# Patient Record
Sex: Female | Born: 1970 | Race: Black or African American | Hispanic: No | Marital: Married | Smoking: Never smoker
Health system: Southern US, Community
[De-identification: ages and names within clinical notes are randomized; demographics above are authoritative.]

## PROBLEM LIST (undated history)

## (undated) DIAGNOSIS — N760 Acute vaginitis: Secondary | ICD-10-CM

## (undated) DIAGNOSIS — IMO0002 Reserved for concepts with insufficient information to code with codable children: Secondary | ICD-10-CM

## (undated) DIAGNOSIS — L659 Nonscarring hair loss, unspecified: Secondary | ICD-10-CM

## (undated) DIAGNOSIS — B9689 Other specified bacterial agents as the cause of diseases classified elsewhere: Secondary | ICD-10-CM

## (undated) DIAGNOSIS — N644 Mastodynia: Secondary | ICD-10-CM

## (undated) DIAGNOSIS — F32A Depression, unspecified: Secondary | ICD-10-CM

## (undated) DIAGNOSIS — N390 Urinary tract infection, site not specified: Secondary | ICD-10-CM

## (undated) DIAGNOSIS — J45909 Unspecified asthma, uncomplicated: Secondary | ICD-10-CM

## (undated) DIAGNOSIS — F419 Anxiety disorder, unspecified: Secondary | ICD-10-CM

## (undated) HISTORY — DX: Reserved for concepts with insufficient information to code with codable children: IMO0002

## (undated) HISTORY — DX: Urinary tract infection, site not specified: N39.0

## (undated) HISTORY — DX: Mastodynia: N64.4

## (undated) HISTORY — DX: Other specified bacterial agents as the cause of diseases classified elsewhere: N76.0

## (undated) HISTORY — DX: Nonscarring hair loss, unspecified: L65.9

## (undated) HISTORY — DX: Other specified bacterial agents as the cause of diseases classified elsewhere: B96.89

---

## 1998-06-14 ENCOUNTER — Ambulatory Visit (HOSPITAL_COMMUNITY): Admission: RE | Admit: 1998-06-14 | Discharge: 1998-06-14 | Payer: Self-pay | Admitting: Family Medicine

## 1999-02-06 ENCOUNTER — Inpatient Hospital Stay (HOSPITAL_COMMUNITY): Admission: AD | Admit: 1999-02-06 | Discharge: 1999-02-10 | Payer: Self-pay | Admitting: Obstetrics and Gynecology

## 1999-08-18 ENCOUNTER — Ambulatory Visit (HOSPITAL_COMMUNITY): Admission: RE | Admit: 1999-08-18 | Discharge: 1999-08-18 | Payer: Self-pay | Admitting: *Deleted

## 1999-08-18 ENCOUNTER — Encounter: Payer: Self-pay | Admitting: *Deleted

## 2002-08-07 ENCOUNTER — Other Ambulatory Visit: Admission: RE | Admit: 2002-08-07 | Discharge: 2002-08-07 | Payer: Self-pay | Admitting: Obstetrics and Gynecology

## 2003-08-07 ENCOUNTER — Ambulatory Visit (HOSPITAL_COMMUNITY): Admission: RE | Admit: 2003-08-07 | Discharge: 2003-08-07 | Payer: Self-pay | Admitting: *Deleted

## 2003-08-07 ENCOUNTER — Encounter (INDEPENDENT_AMBULATORY_CARE_PROVIDER_SITE_OTHER): Payer: Self-pay | Admitting: *Deleted

## 2003-09-10 ENCOUNTER — Other Ambulatory Visit: Admission: RE | Admit: 2003-09-10 | Discharge: 2003-09-10 | Payer: Self-pay | Admitting: Obstetrics and Gynecology

## 2003-12-30 ENCOUNTER — Ambulatory Visit: Admission: RE | Admit: 2003-12-30 | Discharge: 2003-12-30 | Payer: Self-pay | Admitting: Family Medicine

## 2004-09-09 ENCOUNTER — Other Ambulatory Visit: Admission: RE | Admit: 2004-09-09 | Discharge: 2004-09-09 | Payer: Self-pay | Admitting: Obstetrics and Gynecology

## 2005-09-19 ENCOUNTER — Other Ambulatory Visit: Admission: RE | Admit: 2005-09-19 | Discharge: 2005-09-19 | Payer: Self-pay | Admitting: Obstetrics and Gynecology

## 2006-09-25 ENCOUNTER — Other Ambulatory Visit: Admission: RE | Admit: 2006-09-25 | Discharge: 2006-09-25 | Payer: Self-pay | Admitting: Obstetrics and Gynecology

## 2007-03-30 ENCOUNTER — Encounter: Admission: RE | Admit: 2007-03-30 | Discharge: 2007-03-30 | Payer: Self-pay | Admitting: Allergy and Immunology

## 2009-10-26 DIAGNOSIS — IMO0002 Reserved for concepts with insufficient information to code with codable children: Secondary | ICD-10-CM

## 2009-10-26 DIAGNOSIS — R87619 Unspecified abnormal cytological findings in specimens from cervix uteri: Secondary | ICD-10-CM

## 2009-10-26 HISTORY — DX: Unspecified abnormal cytological findings in specimens from cervix uteri: R87.619

## 2009-10-26 HISTORY — DX: Reserved for concepts with insufficient information to code with codable children: IMO0002

## 2010-06-20 ENCOUNTER — Emergency Department (HOSPITAL_BASED_OUTPATIENT_CLINIC_OR_DEPARTMENT_OTHER): Admission: EM | Admit: 2010-06-20 | Discharge: 2010-06-21 | Payer: Self-pay | Admitting: Emergency Medicine

## 2011-06-06 ENCOUNTER — Ambulatory Visit: Payer: Private Health Insurance - Indemnity | Attending: Orthopedic Surgery | Admitting: Physical Therapy

## 2011-06-06 DIAGNOSIS — M25569 Pain in unspecified knee: Secondary | ICD-10-CM | POA: Insufficient documentation

## 2011-06-06 DIAGNOSIS — M25539 Pain in unspecified wrist: Secondary | ICD-10-CM | POA: Insufficient documentation

## 2011-06-06 DIAGNOSIS — IMO0001 Reserved for inherently not codable concepts without codable children: Secondary | ICD-10-CM | POA: Insufficient documentation

## 2011-06-13 ENCOUNTER — Ambulatory Visit: Payer: Private Health Insurance - Indemnity | Admitting: Physical Therapy

## 2011-06-15 ENCOUNTER — Encounter: Payer: Private Health Insurance - Indemnity | Admitting: Physical Therapy

## 2011-06-19 ENCOUNTER — Ambulatory Visit: Payer: Private Health Insurance - Indemnity | Admitting: Physical Therapy

## 2011-06-22 ENCOUNTER — Encounter: Payer: Private Health Insurance - Indemnity | Admitting: Physical Therapy

## 2011-06-26 ENCOUNTER — Ambulatory Visit: Payer: Private Health Insurance - Indemnity | Admitting: Physical Therapy

## 2011-06-28 ENCOUNTER — Ambulatory Visit: Payer: Private Health Insurance - Indemnity | Admitting: Physical Therapy

## 2011-07-11 ENCOUNTER — Ambulatory Visit: Payer: Private Health Insurance - Indemnity | Attending: Orthopedic Surgery | Admitting: Physical Therapy

## 2011-07-11 DIAGNOSIS — IMO0001 Reserved for inherently not codable concepts without codable children: Secondary | ICD-10-CM | POA: Insufficient documentation

## 2011-07-11 DIAGNOSIS — M25539 Pain in unspecified wrist: Secondary | ICD-10-CM | POA: Insufficient documentation

## 2011-07-11 DIAGNOSIS — M25569 Pain in unspecified knee: Secondary | ICD-10-CM | POA: Insufficient documentation

## 2011-07-13 ENCOUNTER — Encounter: Payer: Private Health Insurance - Indemnity | Admitting: Physical Therapy

## 2011-07-18 ENCOUNTER — Encounter: Payer: Private Health Insurance - Indemnity | Admitting: Physical Therapy

## 2011-07-20 ENCOUNTER — Ambulatory Visit: Payer: Private Health Insurance - Indemnity | Admitting: Physical Therapy

## 2011-07-25 ENCOUNTER — Ambulatory Visit: Payer: Private Health Insurance - Indemnity | Admitting: Physical Therapy

## 2011-07-27 ENCOUNTER — Ambulatory Visit: Payer: Private Health Insurance - Indemnity | Admitting: Physical Therapy

## 2012-01-23 ENCOUNTER — Encounter (INDEPENDENT_AMBULATORY_CARE_PROVIDER_SITE_OTHER): Payer: Private Health Insurance - Indemnity | Admitting: Obstetrics and Gynecology

## 2012-01-23 ENCOUNTER — Other Ambulatory Visit: Payer: Self-pay | Admitting: Obstetrics and Gynecology

## 2012-01-23 DIAGNOSIS — N644 Mastodynia: Secondary | ICD-10-CM

## 2012-01-23 DIAGNOSIS — L659 Nonscarring hair loss, unspecified: Secondary | ICD-10-CM

## 2012-01-23 DIAGNOSIS — R21 Rash and other nonspecific skin eruption: Secondary | ICD-10-CM

## 2012-01-23 HISTORY — DX: Mastodynia: N64.4

## 2012-01-23 HISTORY — DX: Nonscarring hair loss, unspecified: L65.9

## 2012-01-25 ENCOUNTER — Ambulatory Visit
Admission: RE | Admit: 2012-01-25 | Discharge: 2012-01-25 | Disposition: A | Discharge status: Home / Self Care | Payer: Private Health Insurance - Indemnity | Source: Ambulatory Visit | Attending: Obstetrics and Gynecology | Admitting: Obstetrics and Gynecology

## 2012-01-25 DIAGNOSIS — N644 Mastodynia: Secondary | ICD-10-CM

## 2012-03-05 ENCOUNTER — Encounter: Payer: Private Health Insurance - Indemnity | Admitting: Obstetrics and Gynecology

## 2012-03-06 ENCOUNTER — Telehealth: Payer: Self-pay | Admitting: Obstetrics and Gynecology

## 2012-03-06 NOTE — Telephone Encounter (Signed)
triage

## 2012-03-12 ENCOUNTER — Encounter: Payer: Private Health Insurance - Indemnity | Admitting: Obstetrics and Gynecology

## 2012-04-11 ENCOUNTER — Encounter: Payer: Private Health Insurance - Indemnity | Admitting: Obstetrics and Gynecology

## 2012-05-08 ENCOUNTER — Ambulatory Visit (INDEPENDENT_AMBULATORY_CARE_PROVIDER_SITE_OTHER): Payer: Private Health Insurance - Indemnity | Admitting: Obstetrics and Gynecology

## 2012-05-08 ENCOUNTER — Encounter: Payer: Self-pay | Admitting: Obstetrics and Gynecology

## 2012-05-08 VITALS — BP 118/80 | Ht 60.0 in | Wt 177.0 lb

## 2012-05-08 DIAGNOSIS — N644 Mastodynia: Secondary | ICD-10-CM

## 2012-05-08 NOTE — Progress Notes (Signed)
Pt here for f/u of breast pain.  Pain does not occur all the time but now happens two weeks before menses BP 118/80  Ht 5' (1.524 m)  Wt 177 lb (80.287 kg)  BMI 34.57 kg/m2  LMP 05/03/2012 Physical Examination: General appearance - alert, well appearing, and in no distress Chest - clear to auscultation, no wheezes, rales or rhonchi, symmetric air entry Heart - normal rate and regular rhythm Abdomen - soft, nontender, nondistended, no masses or organomegaly Breasts - breasts appear normal, no suspicious masses, no skin or nipple changes or axillary nodes Breast pain Mammogram WNL Pt considering changing BC and using continuous Motrin PRn Support bra

## 2012-05-08 NOTE — Patient Instructions (Signed)
Patient Education Materials to be provided at check out (*indicates is located in Garment/textile technologist):  *Nexplanon, *Nuvaring

## 2012-05-22 ENCOUNTER — Telehealth: Payer: Self-pay

## 2012-05-22 NOTE — Telephone Encounter (Signed)
Spoke with pt rgd msg informed rx changed to lo loestrin per nd f/u 4 months pt will come by to pick up rx

## 2012-05-22 NOTE — Telephone Encounter (Signed)
Message copied by Rolla Plate on Wed May 22, 2012  3:40 PM ------      Message from: Jaymes Graff      Created: Wed May 22, 2012  2:30 PM       Pt may have Lo Lo estrin one tab PO QD.  Give three refillls and pt needs to f/u in 4 months      ----- Message -----         From: Claudie Leach, CMA         Sent: 05/20/2012  10:22 AM           To: Michael Litter, MD            Pt calling with decision pt wants birth control pills with low dose estrogen pt wants to pick up rx

## 2012-05-22 NOTE — Telephone Encounter (Signed)
Spoke with pt rgd msg informed pt will consult with [place] rgd changing bc and call her back pt voice understanding

## 2012-06-04 ENCOUNTER — Other Ambulatory Visit: Payer: Self-pay

## 2012-10-28 ENCOUNTER — Other Ambulatory Visit: Payer: Self-pay | Admitting: Allergy and Immunology

## 2012-10-28 ENCOUNTER — Ambulatory Visit
Admission: RE | Admit: 2012-10-28 | Discharge: 2012-10-28 | Disposition: A | Discharge status: Home / Self Care | Payer: Managed Care, Other (non HMO) | Source: Ambulatory Visit | Attending: Allergy and Immunology | Admitting: Allergy and Immunology

## 2012-10-28 DIAGNOSIS — J069 Acute upper respiratory infection, unspecified: Secondary | ICD-10-CM

## 2012-12-06 ENCOUNTER — Encounter: Payer: Self-pay | Admitting: Obstetrics and Gynecology

## 2012-12-06 ENCOUNTER — Ambulatory Visit: Payer: Private Health Insurance - Indemnity | Admitting: Obstetrics and Gynecology

## 2012-12-06 VITALS — BP 140/80 | HR 82 | Ht 59.75 in | Wt 171.0 lb

## 2012-12-06 DIAGNOSIS — Z124 Encounter for screening for malignant neoplasm of cervix: Secondary | ICD-10-CM

## 2012-12-06 NOTE — Progress Notes (Signed)
Last Pap: 11/14/11 WNL: Yes Regular Periods:no Contraception: loestrin fe  Monthly Breast exam:yes sometimes Tetanus<70yrs:no pt unsure Nl.Bladder Function:yes Daily BMs:no, but regular BMs  Healthy Diet:yes Calcium:yes Mammogram:yes Date of Mammogram: 12/2011 Exercise:yes Have often Exercise: just starting  Seatbelt: yes Abuse at home: no Stressful work:yes Sigmoid-colonoscopy: /2013 Bone Density: No PCP: Dr. Merri Brunette  Change in PMH: no changes  Change in [facility]: father with colon cancer BP 140/80  Pulse 82  Ht 4' 11.75" (1.518 m)  Wt 171 lb (77.565 kg)  BMI 33.68 kg/m2 Pt with complaints:no Physical Examination: General appearance - alert, well appearing, and in no distress Mental status - normal mood, behavior, speech, dress, motor activity, and thought processes Neck - supple, no significant adenopathy,  thyroid exam: thyroid is normal in size without nodules or tenderness Chest - clear to auscultation, no wheezes, rales or rhonchi, symmetric air entry Heart - normal rate and regular rhythm Abdomen - soft, nontender, nondistended, no masses or organomegaly Breasts - breasts appear normal, no suspicious masses, no skin or nipple changes or axillary nodes Pelvic - normal external genitalia, vulva, vagina, cervix, uterus and adnexa Rectal - rectal exam not indicated Back exam - full range of motion, no tenderness, palpable spasm or pain on motion Neurological - alert, oriented, normal speech, no focal findings or movement disorder noted Musculoskeletal - no joint tenderness, deformity or swelling Extremities - no edema, redness or tenderness in the calves or thighs Skin - normal coloration and turgor, no rashes, no suspicious skin lesions noted Routine exam Pap sent no due in three years Mammogram due yes will scheudle OCP used for contraception RT 1 yr B

## 2012-12-09 LAB — PAP IG W/ RFLX HPV ASCU

## 2012-12-10 ENCOUNTER — Telehealth: Payer: Self-pay

## 2012-12-10 NOTE — Telephone Encounter (Signed)
Try calling pt rgd sending rx to mail order no answer unable to leave msg

## 2012-12-10 NOTE — Telephone Encounter (Signed)
Message copied by Rolla Plate on Tue Dec 10, 2012  9:59 AM ------      Message from: Jaymes Graff      Created: Fri Dec 06, 2012  3:24 PM       Please order her [facility] pills.  She uses mail order ------

## 2012-12-11 ENCOUNTER — Telehealth: Payer: Self-pay

## 2012-12-11 MED ORDER — NORETHIN ACE-ETH ESTRAD-FE 1-20 MG-MCG PO TABS: 1.0000 | ORAL_TABLET | Freq: Every day | ORAL | Status: DC

## 2012-12-11 NOTE — Telephone Encounter (Signed)
Message copied by Rolla Plate on Wed Dec 11, 2012 10:16 AM ------      Message from: Jaymes Graff      Created: Wed Dec 11, 2012 12:32 AM       Please schedule pt for colposcopy. ------

## 2012-12-11 NOTE — Telephone Encounter (Signed)
Spoke with pt rgd labs informed pap showed abnl cells need colpo pt has appt 12/30/12 at 2:45 with nd pt voice understanding pt request bc be sent to pharm for three month supply informed rx sent to pharm

## 2012-12-30 ENCOUNTER — Ambulatory Visit: Payer: Private Health Insurance - Indemnity | Admitting: Obstetrics and Gynecology

## 2012-12-30 ENCOUNTER — Encounter: Payer: Self-pay | Admitting: Obstetrics and Gynecology

## 2012-12-30 VITALS — BP 126/80 | Wt 173.0 lb

## 2012-12-30 DIAGNOSIS — IMO0002 Reserved for concepts with insufficient information to code with codable children: Secondary | ICD-10-CM

## 2012-12-30 LAB — POCT URINE PREGNANCY: Preg Test, Ur: NEGATIVE

## 2012-12-30 NOTE — Progress Notes (Signed)
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

## 2012-12-30 NOTE — Patient Instructions (Signed)
Colposcopy  Colposcopy is a procedure that uses a special lighted microscope (colposcope). It examines your cervix and vagina, or the area around the outside of the vagina, for signs of disease or abnormalities in the cells. You may be sent to a specialist (gynecologist) to do the colposcopy. A biopsy (tissue sample) may be collected during a colposcopy, if the caregiver finds any unusual cells. The biopsy is sent to the lab for further testing, and the results are reported back to your caregiver.  A WOMAN MAY NEED THIS PROCEDURE IF:  · She has had an abnormal pap smear (taking cells from the cervix for testing).  · She has a sore on her cervix, and a Pap test was normal.  · The Pap test suggests human papilloma virus (HPV). This virus can cause genital warts and is linked to the development of cervical cancer.  · She has genital warts on the cervix, or in or around the outside of the vagina.  · Her mother took the drug DES while pregnant.  · She has painful intercourse.  · She has vaginal bleeding, especially after sexual intercourse.  · There is a need to evaluate the results of previous treatment.  BEFORE THE PROCEDURE   · Colposcopy is done when you are not having a menstrual period.  · For 24 hours before the colposcopy, do not:  · Douche.  · Use tampons.  · Use medicines, creams, or suppositories in the vagina.  · Have sexual intercourse.  PROCEDURE   · A colposcopy is done while a woman is lying on her back with her feet in foot rests (stirrups).  · A speculum is placed inside the vagina to keep it open and to allow the caregiver to see the cervix. This is the same instrument used to do a pap smear.  · The colposcope is placed outside the vagina. It is used to magnify and examine the cervix, vagina, and the area around the outside of the vagina.  · A small amount of liquid solution is placed on the area that is to be viewed. This solution is placed on with a cotton applicator. This solution makes it easier to  see the abnormal cells.  · Your caregiver will suck out mucus and cells from the canal of the cervix.  · Small pieces of tissue for biopsy may be taken at the same time. You may feel mild pain or discomfort when this is done.  · Your caregiver will record the location of the abnormal areas and send the tissue samples to a lab for analysis.  · If your caregiver biopsies the vagina or outside of the vagina, a local anesthetic (novocaine) is usually given.  AFTER THE PROCEDURE   · You may have some cramping that often goes away in a few minutes. You may have some soreness for a couple of days.  · You may take over-the-counter pain medicine as advised by your caregiver. Do not take aspirin because it can cause bleeding.  · Lie down for a few minutes if you feel lightheaded.  · You may have some bleeding or dark discharge that should stop in a few days.  · You may need to wear a sanitary pad for a few days.  HOME CARE INSTRUCTIONS   · Avoid sex, douching, and using tampons for a week or as directed.  · Only take medicine as directed by your caregiver.  · Continue to take birth control pills, if you are on them.  ·   Not all test results are available during your visit. If your test results are not back during the visit, make an appointment with your caregiver to find out the results. Do not assume everything is normal if you have not heard from your caregiver or the medical facility. It is important for you to follow up on all of your test results.  · Follow your caregiver's advice regarding medicines, activity, follow-up visits, and follow-up Pap tests.  SEEK MEDICAL CARE IF:   · You develop a rash.  · You have problems with your medicine.  SEEK IMMEDIATE MEDICAL CARE IF:  · You are bleeding heavily or are passing blood clots.  · You develop a fever over 102° F (38.9° C), with or without chills.  · You have abnormal vaginal discharge.  · You are having cramps that do not go away after taking your pain medicine.  · You  feel lightheaded, dizzy, or faint.  · You develop stomach pain.  Document Released: 01/06/2003 Document Revised: 01/08/2012 Document Reviewed: 08/19/2009  Exit[employer]® Patient Information ©2013 [employer].

## 2013-01-10 ENCOUNTER — Telehealth: Payer: Self-pay

## 2013-01-10 NOTE — Telephone Encounter (Signed)
Message copied by Rolla Plate on Fri Jan 10, 2013 11:16 AM ------      Message from: Jaymes Graff      Created: Thu Jan 09, 2013 11:47 PM       Please review colpo results with patient and tell her I recommend a pap every six months for the next year. ------

## 2013-01-10 NOTE — Telephone Encounter (Signed)
Spoke with pt rgd labs informed repeat pap every 6 months pt voice understanding

## 2013-01-27 ENCOUNTER — Ambulatory Visit: Payer: Managed Care, Other (non HMO)

## 2013-02-12 ENCOUNTER — Ambulatory Visit: Payer: Managed Care, Other (non HMO)

## 2013-02-17 ENCOUNTER — Ambulatory Visit
Admission: RE | Admit: 2013-02-17 | Discharge: 2013-02-17 | Disposition: A | Discharge status: Home / Self Care | Payer: Managed Care, Other (non HMO) | Source: Ambulatory Visit | Attending: Obstetrics and Gynecology | Admitting: Obstetrics and Gynecology

## 2013-02-17 DIAGNOSIS — Z1231 Encounter for screening mammogram for malignant neoplasm of breast: Secondary | ICD-10-CM

## 2013-02-17 DIAGNOSIS — Z124 Encounter for screening for malignant neoplasm of cervix: Secondary | ICD-10-CM

## 2013-09-04 ENCOUNTER — Other Ambulatory Visit: Payer: Self-pay

## 2014-02-23 ENCOUNTER — Other Ambulatory Visit: Payer: Self-pay | Admitting: Gastroenterology

## 2014-02-23 DIAGNOSIS — R109 Unspecified abdominal pain: Secondary | ICD-10-CM

## 2014-02-26 ENCOUNTER — Ambulatory Visit
Admission: RE | Admit: 2014-02-26 | Discharge: 2014-02-26 | Disposition: A | Discharge status: Home / Self Care | Payer: Managed Care, Other (non HMO) | Source: Ambulatory Visit | Attending: Gastroenterology | Admitting: Gastroenterology

## 2014-02-26 DIAGNOSIS — R109 Unspecified abdominal pain: Secondary | ICD-10-CM

## 2014-02-26 MED ORDER — IOHEXOL 300 MG/ML  SOLN
100.0000 mL | Freq: Once | INTRAMUSCULAR | Status: AC | PRN
Start: 2014-02-26 — End: 2014-02-26
  Administered 2014-02-26: 100 mL via INTRAVENOUS

## 2014-03-03 ENCOUNTER — Other Ambulatory Visit: Payer: Self-pay | Admitting: Family Medicine

## 2014-03-03 DIAGNOSIS — N281 Cyst of kidney, acquired: Secondary | ICD-10-CM

## 2014-03-09 ENCOUNTER — Ambulatory Visit
Admission: RE | Admit: 2014-03-09 | Discharge: 2014-03-09 | Disposition: A | Discharge status: Home / Self Care | Payer: Managed Care, Other (non HMO) | Source: Ambulatory Visit | Attending: Family Medicine | Admitting: Family Medicine

## 2014-03-09 DIAGNOSIS — N281 Cyst of kidney, acquired: Secondary | ICD-10-CM

## 2014-03-31 ENCOUNTER — Other Ambulatory Visit: Payer: Self-pay

## 2014-03-31 DIAGNOSIS — Z1231 Encounter for screening mammogram for malignant neoplasm of breast: Secondary | ICD-10-CM

## 2014-04-06 ENCOUNTER — Encounter (HOSPITAL_COMMUNITY): Payer: Self-pay | Admitting: Emergency Medicine

## 2014-04-06 ENCOUNTER — Emergency Department (HOSPITAL_COMMUNITY)
Admission: EM | Admit: 2014-04-06 | Discharge: 2014-04-06 | Disposition: A | Discharge status: Home / Self Care | Payer: Managed Care, Other (non HMO) | Attending: Emergency Medicine | Admitting: Emergency Medicine

## 2014-04-06 DIAGNOSIS — Z8619 Personal history of other infectious and parasitic diseases: Secondary | ICD-10-CM | POA: Insufficient documentation

## 2014-04-06 DIAGNOSIS — Z87440 Personal history of urinary (tract) infections: Secondary | ICD-10-CM | POA: Insufficient documentation

## 2014-04-06 DIAGNOSIS — F3289 Other specified depressive episodes: Secondary | ICD-10-CM | POA: Insufficient documentation

## 2014-04-06 DIAGNOSIS — L659 Nonscarring hair loss, unspecified: Secondary | ICD-10-CM | POA: Insufficient documentation

## 2014-04-06 DIAGNOSIS — F410 Panic disorder [episodic paroxysmal anxiety] without agoraphobia: Secondary | ICD-10-CM

## 2014-04-06 DIAGNOSIS — Z8742 Personal history of other diseases of the female genital tract: Secondary | ICD-10-CM | POA: Insufficient documentation

## 2014-04-06 DIAGNOSIS — F32A Depression, unspecified: Secondary | ICD-10-CM

## 2014-04-06 DIAGNOSIS — F329 Major depressive disorder, single episode, unspecified: Secondary | ICD-10-CM | POA: Insufficient documentation

## 2014-04-06 DIAGNOSIS — Z79899 Other long term (current) drug therapy: Secondary | ICD-10-CM | POA: Insufficient documentation

## 2014-04-06 DIAGNOSIS — IMO0002 Reserved for concepts with insufficient information to code with codable children: Secondary | ICD-10-CM | POA: Insufficient documentation

## 2014-04-06 HISTORY — DX: Anxiety disorder, unspecified: F41.9

## 2014-04-06 MED ORDER — ALPRAZOLAM 0.5 MG PO TABS: 0.5000 mg | ORAL_TABLET | Freq: Two times a day (BID) | ORAL | Status: AC | PRN

## 2014-04-06 MED ORDER — LORAZEPAM 2 MG/ML IJ SOLN
1.0000 mg | Freq: Once | INTRAMUSCULAR | Status: AC
  Administered 2014-04-06: 1 mg via INTRAMUSCULAR
  Filled 2014-04-06: qty 1

## 2014-04-06 NOTE — Discharge Instructions (Signed)
Please follow with your primary care doctor in the next 2 days for a check-up. They must obtain records for further management.   Do not hesitate to return to the Emergency Department for any new, worsening or concerning symptoms.    Panic Attacks Panic attacks are sudden, short-livedsurges of severe anxiety, fear, or discomfort. They may occur for no reason when you are relaxed, when you are anxious, or when you are sleeping. Panic attacks may occur for a number of reasons:   Healthy people occasionally have panic attacks in extreme, life-threatening situations, such as war or natural disasters. Normal anxiety is a protective mechanism of the body that helps [place] react to danger (fight or flight response).  Panic attacks are often seen with anxiety disorders, such as panic disorder, social anxiety disorder, generalized anxiety disorder, and phobias. Anxiety disorders cause excessive or uncontrollable anxiety. They may interfere with your relationships or other life activities.  Panic attacks are sometimes seen with other mental illnesses such as depression and posttraumatic stress disorder.  Certain medical conditions, prescription medicines, and drugs of abuse can cause panic attacks. SYMPTOMS  Panic attacks start suddenly, peak within 20 minutes, and are accompanied by four or more of the following symptoms:  Pounding heart or fast heart rate (palpitations).  Sweating.  Trembling or shaking.  Shortness of breath or feeling smothered.  Feeling choked.  Chest pain or discomfort.  Nausea or strange feeling in your stomach.  Dizziness, lightheadedness, or feeling like you will faint.  Chills or hot flushes.  Numbness or tingling in your lips or hands and feet.  Feeling that things are not real or feeling that you are not yourself.  Fear of losing control or going crazy.  Fear of dying. Some of these symptoms can mimic serious medical conditions. For example, you may think you  are having a heart attack. Although panic attacks can be very scary, they are not life threatening. DIAGNOSIS  Panic attacks are diagnosed through an assessment by your health care provider. Your health care provider will ask questions about your symptoms, such as where and when they occurred. Your health care provider will also ask about your medical history and use of alcohol and drugs, including prescription medicines. Your health care provider may order blood tests or other studies to rule out a serious medical condition. Your health care provider may refer you to a mental health professional for further evaluation. TREATMENT   Most healthy people who have one or two panic attacks in an extreme, life-threatening situation will not require treatment.  The treatment for panic attacks associated with anxiety disorders or other mental illness typically involves counseling with a mental health professional, medicine, or a combination of both. Your health care provider will help determine what treatment is best for you.  Panic attacks due to physical illness usually goes away with treatment of the illness. If prescription medicine is causing panic attacks, talk with your health care provider about stopping the medicine, decreasing the dose, or substituting another medicine.  Panic attacks due to alcohol or drug abuse goes away with abstinence. Some adults need professional help in order to stop drinking or using drugs. HOME CARE INSTRUCTIONS   Take all your medicines as prescribed.   Check with your health care provider before starting new prescription or over-the-counter medicines.  Keep all follow up appointments with your health care provider. SEEK MEDICAL CARE IF:  You are not able to take your medicines as prescribed.  Your symptoms do  not improve or get worse. SEEK IMMEDIATE MEDICAL CARE IF:   You experience panic attack symptoms that are different than your usual symptoms.  You have  serious thoughts about hurting yourself or others.  You are taking medicine for panic attacks and have a serious side effect. MAKE SURE YOU:  Understand these instructions.  Will watch your condition.  Will get help right away if you are not doing well or get worse. Document Released: 10/16/2005 Document Revised: 08/06/2013 Document Reviewed: 05/30/2013 [facility] Patient Information 2014 [place], [place].

## 2014-04-06 NOTE — ED Provider Notes (Signed)
CSN: [id number]     Arrival date & time 04/06/14  0856 History   First MD Initiated Contact with Patient 04/06/14 [phone]     Chief Complaint  Patient presents with  . Anxiety     (Consider location/radiation/quality/duration/timing/severity/associated sxs/prior Treatment) HPI  Candice Noble is a 43 y.o. female complaining of anxiety and panic onset several hours ago. Patient states that she feel feels overwhelmed and has a history of depression for which she is being treated by her primary care doctor with [facility], but she's been compliant with. Patient also sees a therapist and has an appointment at psychiatry next week. Patient denies any suicidal ideation, homicidal ideation, body or visual hallucinations, drug or alcohol abuse. States that there were no particular events in her life that are setting off the panic attacks, she's never had one this bad. Patient denies chest pain, states that initially when she felt agitated there was a chest tightness but this resolved. She denies any palpitations, abnormal caffeine intake, shortness of breath, abdominal pain, nausea or vomiting.    Past Medical History  Diagnosis Date  . Abnormal Pap smear 10/26/2009    ASCUS  . Breast pain 01/23/12  . Alopecia 01/23/12  . BV (bacterial vaginosis)   . UTI (urinary tract infection)   . Anxiety    History reviewed. No pertinent past surgical history. Family History  Problem Relation Age of Onset  . Cancer Father     PROSTATE, stage 4 colon    History  Substance Use Topics  . Smoking status: Never Smoker   . Smokeless tobacco: Not on file  . Alcohol Use: Yes     Comment: occassionally    OB History   Grav Para Term Preterm Abortions TAB SAB Ect Mult Living   1 1        1      Review of Systems  10 systems reviewed and found to be negative, except as noted in the HPI.   Allergies  Other  Home Medications   Prior to Admission medications   Medication Sig Start Date End Date Taking?  Authorizing Provider  albuterol (PROVENTIL HFA;VENTOLIN HFA) 108 (90 BASE) MCG/ACT inhaler Inhale 2 puffs into the lungs every 6 (six) hours as needed for wheezing or shortness of breath.   Yes Historical Provider, MD  Azelastine-Fluticasone (DYMISTA NA) Place 1 spray into both nostrils daily.    Yes Historical Provider, MD  clindamycin-benzoyl peroxide (BENZACLIN) gel Apply 1 application topically daily.   Yes Historical Provider, MD  Levocetirizine Dihydrochloride (XYZAL PO) Take 5 mg by mouth daily.    Yes Historical Provider, MD  mometasone-formoterol (DULERA) 200-5 MCG/ACT AERO Inhale 2 puffs into the lungs daily.   Yes Historical Provider, MD  Montelukast Sodium (SINGULAIR PO) Take 10 mg by mouth daily.    Yes Historical Provider, MD  Multiple Vitamins-Minerals (HAIR/SKIN/NAILS PO) Take 3 tablets by mouth daily.   Yes Historical Provider, MD  tretinoin (RETIN-A) 0.025 % gel Apply 1 application topically at bedtime.   Yes Historical Provider, MD  Vilazodone HCl (VIIBRYD) 40 MG TABS Take 40 mg by mouth daily.   Yes Historical Provider, MD  ALPRAZolam Prudy Feeler) 0.5 MG tablet Take 1 tablet (0.5 mg total) by mouth 2 (two) times daily as needed for anxiety. 04/06/14   Leor Whyte, PA-C   BP 149/88  Pulse 80  Temp(Src) 98 F (36.7 C) (Oral)  Resp 14  SpO2 100%  LMP 04/03/2014 Physical Exam  Nursing note and vitals reviewed.  Constitutional: She is oriented to person, place, and time. She appears well-developed and well-nourished. No distress.  HENT:  Head: Normocephalic and atraumatic.  Mouth/Throat: Oropharynx is clear and moist.  Eyes: Conjunctivae and EOM are normal. Pupils are equal, round, and reactive to light.  Cardiovascular: Normal rate, regular rhythm and intact distal pulses.   Pulmonary/Chest: Effort normal and breath sounds normal. No stridor. No respiratory distress. She has no wheezes. She has no rales. She exhibits no tenderness.  Abdominal: Soft. Bowel sounds are normal.  She exhibits no distension and no mass. There is no tenderness. There is no rebound and no guarding.  Musculoskeletal: Normal range of motion.  Neurological: She is alert and oriented to person, place, and time.  Psychiatric: Her speech is normal. Judgment and thought content normal. She is agitated. She is not aggressive, not hyperactive, not actively hallucinating and not combative. She exhibits a depressed mood.  Tearful She is attentive.    ED Course  Procedures (including critical care time) Labs Review Labs Reviewed - No data to display  Imaging Review No results found.   EKG Interpretation None      MDM   Final diagnoses:  Panic attack  Depression    Filed Vitals:   04/06/14 0905 04/06/14 1148  BP: 135/90 149/88  Pulse: 87 80  Temp: 98 F (36.7 C) 98 F (36.7 C)  TempSrc: Oral Oral  Resp: 20 14  SpO2: 98% 100%    Medications  LORazepam (ATIVAN) injection 1 mg (1 mg Intramuscular Given 04/06/14 1036)    Candice Noble is a 43 y.o. female presenting with panic attack, history of depression. Patient compliant with her antidepressant medication. Is being managed by primary care, counselor and has an appointment with a psychiatrist. No SI, HI, or hallucinations. Patient improved after IM Ativan. Stable for discharge to home. Encourage close followup with counselors.   Evaluation does not show pathology that would require ongoing emergent intervention or inpatient treatment. Pt is hemodynamically stable and mentating appropriately. Discussed findings and plan with patient/guardian, who agrees with care plan. All questions answered. Return precautions discussed and outpatient follow up given.   Discharge Medication List as of 04/06/2014 12:15 PM    START taking these medications   Details  ALPRAZolam (XANAX) 0.5 MG tablet Take 1 tablet (0.5 mg total) by mouth 2 (two) times daily as needed for anxiety., Starting 04/06/2014, Until Discontinued, [employer]              Trelyn Vanderlinde, PA-C 04/06/14 1311

## 2014-04-06 NOTE — ED Provider Notes (Signed)
Medical screening examination/treatment/procedure(s) were performed by non-physician practitioner and as supervising physician I was immediately available for consultation/collaboration.  Flint Melter, MD 04/06/14 [phone]

## 2014-04-06 NOTE — ED Notes (Signed)
Pt states she doesn't feel right. States he doesn't;t know if it is anxiety but her chest feels tight and she feels nervous.

## 2014-04-16 ENCOUNTER — Ambulatory Visit
Admission: RE | Admit: 2014-04-16 | Discharge: 2014-04-16 | Disposition: A | Discharge status: Home / Self Care | Payer: Managed Care, Other (non HMO) | Source: Ambulatory Visit

## 2014-04-16 DIAGNOSIS — Z1231 Encounter for screening mammogram for malignant neoplasm of breast: Secondary | ICD-10-CM

## 2014-05-04 ENCOUNTER — Ambulatory Visit: Payer: Managed Care, Other (non HMO) | Admitting: Dietician

## 2014-06-16 ENCOUNTER — Ambulatory Visit: Payer: Managed Care, Other (non HMO) | Admitting: Dietician

## 2014-08-31 ENCOUNTER — Encounter (HOSPITAL_COMMUNITY): Payer: Self-pay | Admitting: Emergency Medicine

## 2015-01-17 IMAGING — CT CT ABD-PELV W/ CM
2 of 5 series · 16 of 46 positions shown, 18 images · IV contrast (READICAT/WATER & [ID] OMNI 300)
Comparison: None.

CLINICAL DATA: Abdominal pain

EXAM:
CT ABDOMEN AND PELVIS WITH CONTRAST
TECHNIQUE: Multidetector CT imaging of the abdomen and pelvis was performed
using the standard protocol following bolus administration of
intravenous contrast.
CONTRAST:  100mL OMNIPAQUE IOHEXOL 300 MG/ML  SOLN

[Series 2: abd/pelvis with · axial · 0.70mm/px · z∈[-342,+8]mm · 13 of 78 slices shown, 15 images]
[im 5/78  soft-tissue]
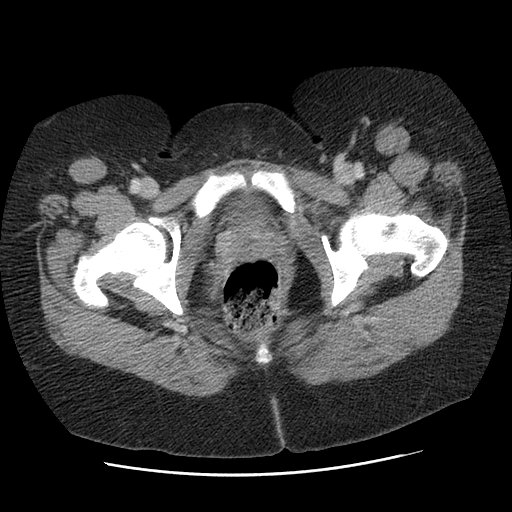
[im 5/78  bone]
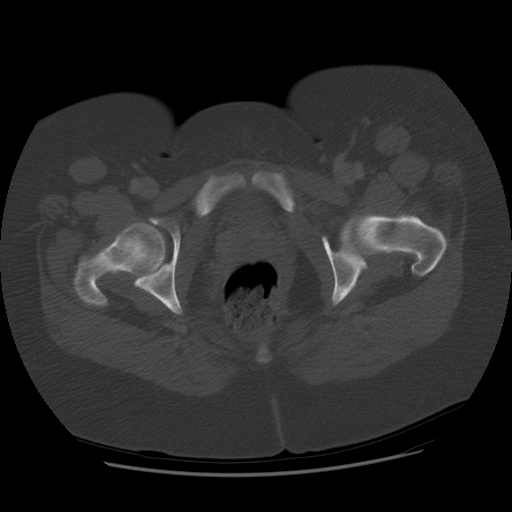
[im 10/78  soft-tissue]
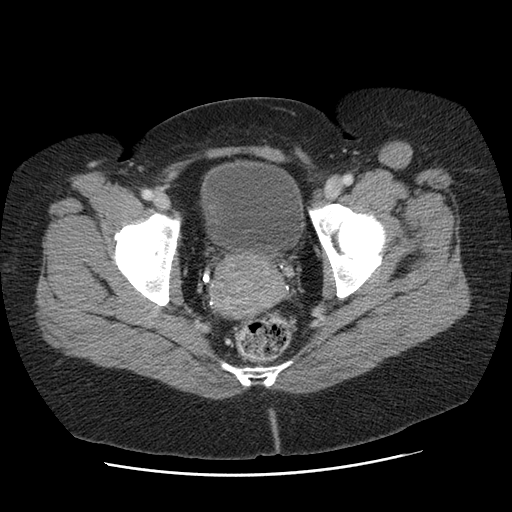
[im 19/78  soft-tissue]
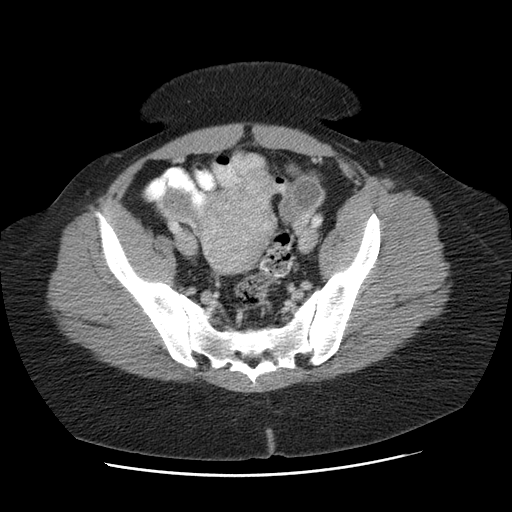
[im 23/78  soft-tissue]
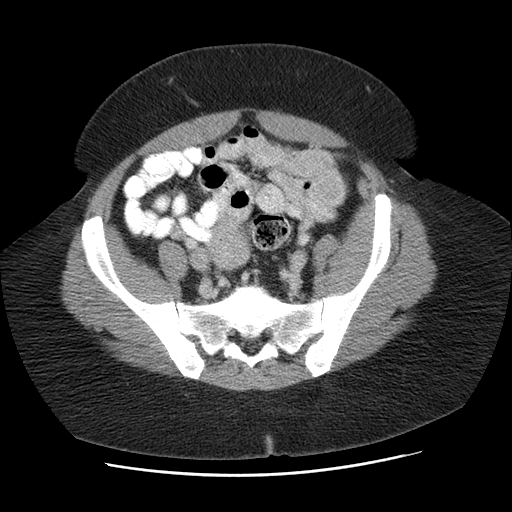
[im 28/78  soft-tissue]
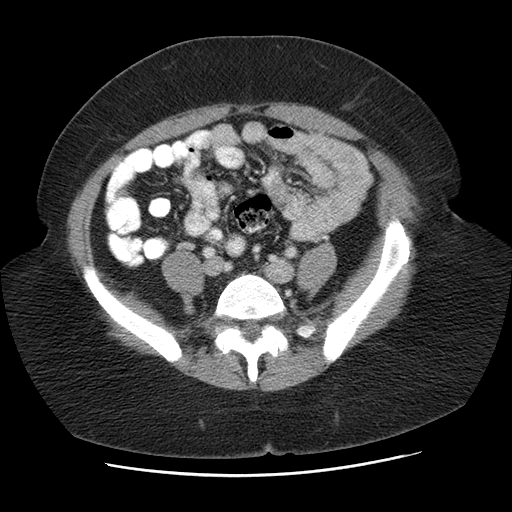
[im 32/78  soft-tissue]
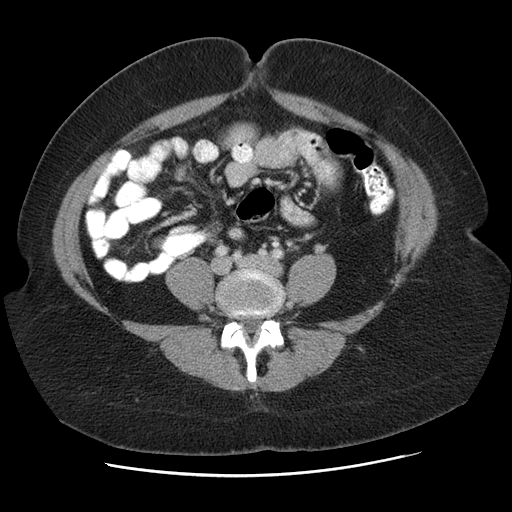
[im 41/78  soft-tissue]
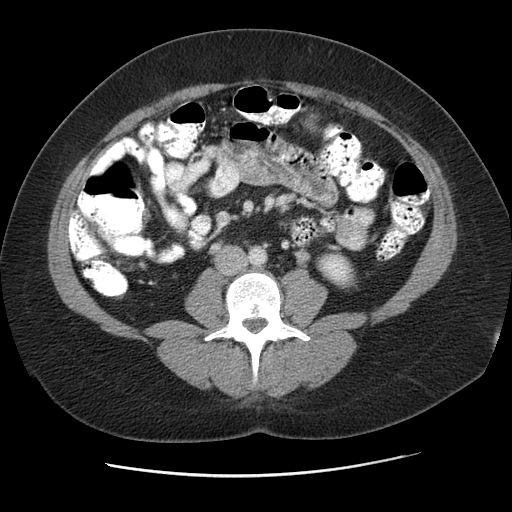
[im 46/78  soft-tissue]
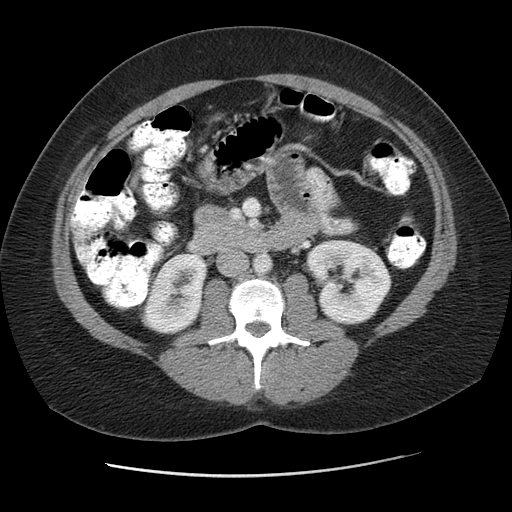
[im 50/78  soft-tissue]
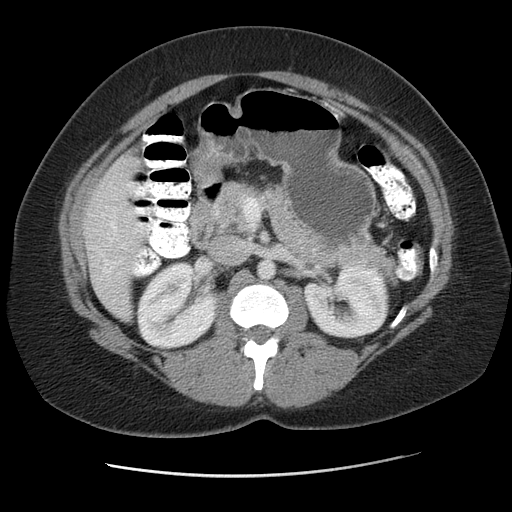
[im 50/78  bone]
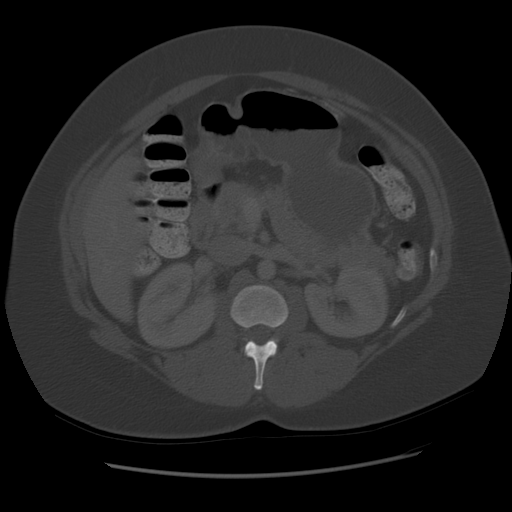
[im 55/78  soft-tissue]
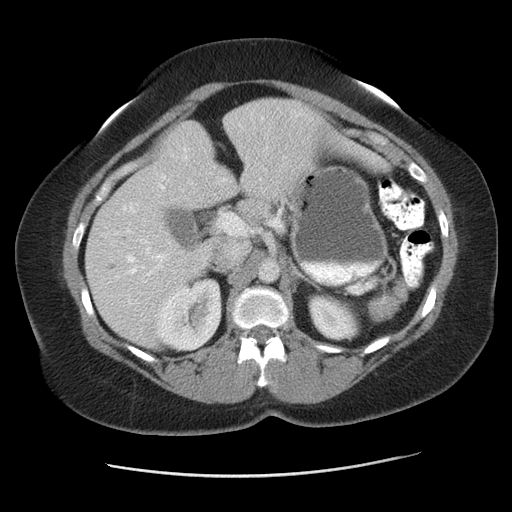
[im 59/78  soft-tissue]
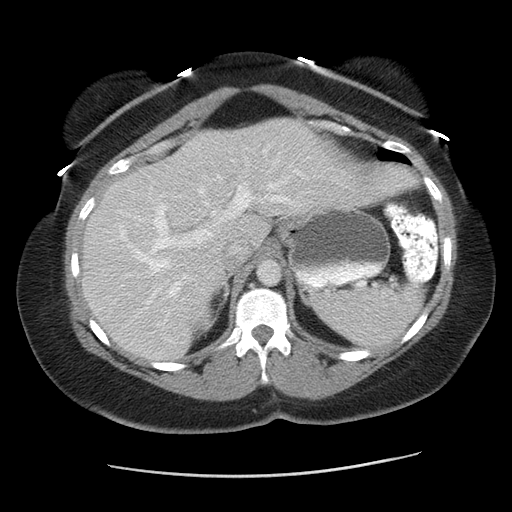
[im 68/78  soft-tissue]
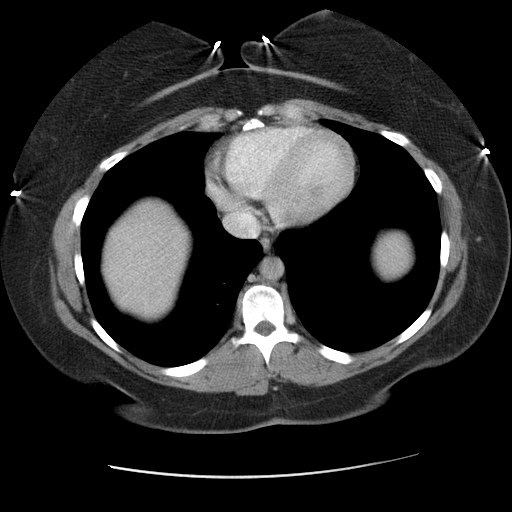
[im 73/78  soft-tissue]
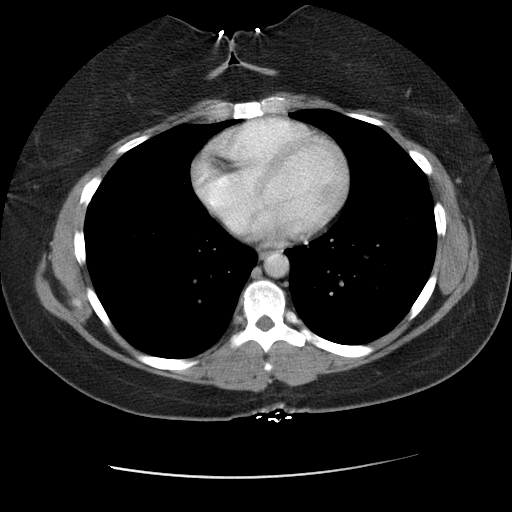

[Series 400: cor · coronal · 0.88mm/px · 3 of 128 slices shown]
[im 43/128  soft-tissue]
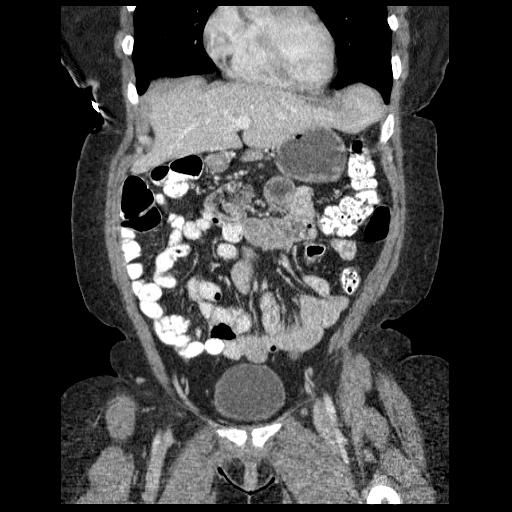
[im 57/128  soft-tissue]
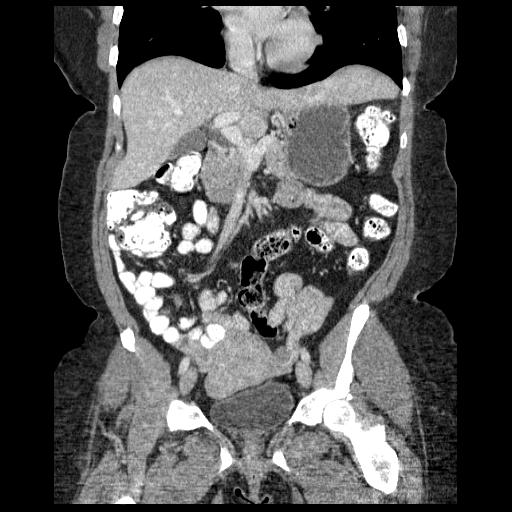
[im 71/128  soft-tissue]
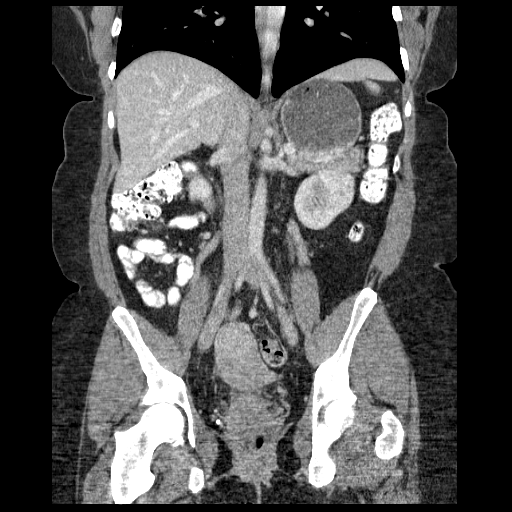

[16 of 46 positions shown; findings below may reference images not displayed]

FINDINGS: The lung bases are clear. The liver enhances with only a tiny sub cm
low-attenuation structure in the inferior right lobe most consistent
with benign process head such as cysts, but too small to
characterize. No calcified gallstones are seen. The common but duct
is within upper limits of normal measuring 6 mm in diameter in
correlation with LFTs is recommended. The pancreas is normal in size
and the pancreatic duct is not dilated. The adrenal glands and
spleen are unremarkable. The stomach is partially distended with
contrast and is unremarkable. The kidneys enhance and there is a 5
mm nonobstructing calculus in the lower pole of the right kidney. A
rounded low-attenuation structure is noted in the mid left kidney
with and attenuation of 25 HU measuring 16 mm in diameter. This may
represent a slightly complex cyst, but ultrasound is recommended to
assess further. The abdominal aorta is normal in caliber. No
adenopathy is seen.

The uterus is inhomogeneous and lobular most consistent with the
presence of multiple uterine fibroids. Bilateral ovarian cysts are
noted but no significant free fluid is seen within the pelvis. The
urinary bladder is moderately urine distended with no abnormality
noted. The colon is unremarkable. The terminal ileum appears normal.
The appendix is best seen on sagittal images with no evidence of
inflammation.
IMPRESSION: 1. Prominent lobular inhomogeneous uterus most consistent with the
presence of multiple uterine fibroids. Small ovarian cysts
bilaterally. No free fluid.
2. Slightly prominent common bile duct. Correlate with liver
function tests.
3. 5 mm nonobstructing lower pole right renal calculus.
4. 16 mm low-attenuation structure in the mid left kidney most
consistent with slightly complex cyst. Consider ultrasound of the
kidneys to assess further.
5. The terminal ileum and appendix are unremarkable.

## 2015-01-28 IMAGING — US US RENAL
1 series · 14 of 25 positions shown · non-contrast
Comparison: CT abdomen pelvis of 02/26/2014

CLINICAL DATA: Low-attenuation left renal lesion noted on CT

EXAM:
RENAL/URINARY TRACT ULTRASOUND COMPLETE

[Series 1: us renal · 0.24mm/px · 14 of 40 slices shown]
[im 1/40]
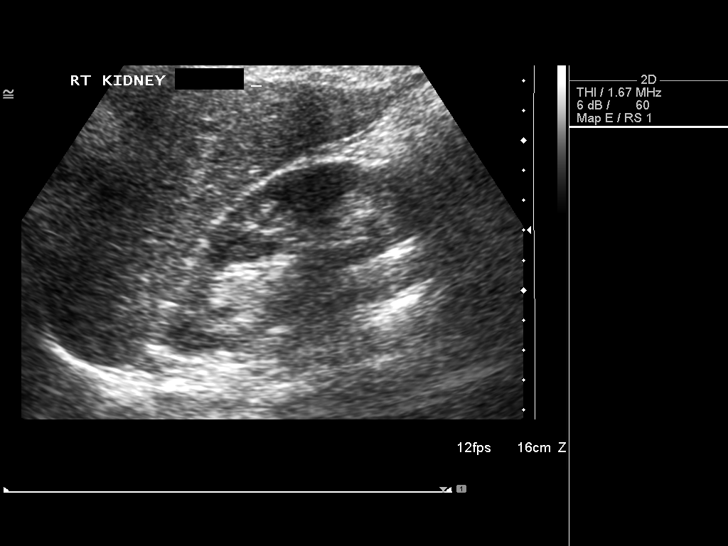
[im 4/40]
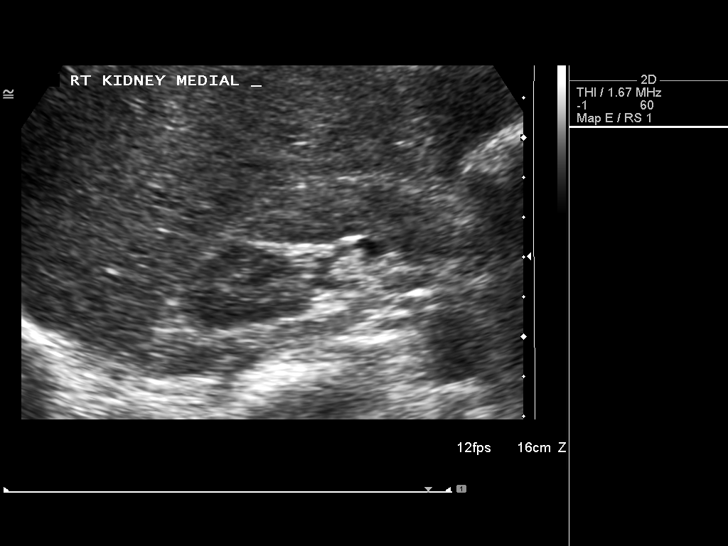
[im 7/40]
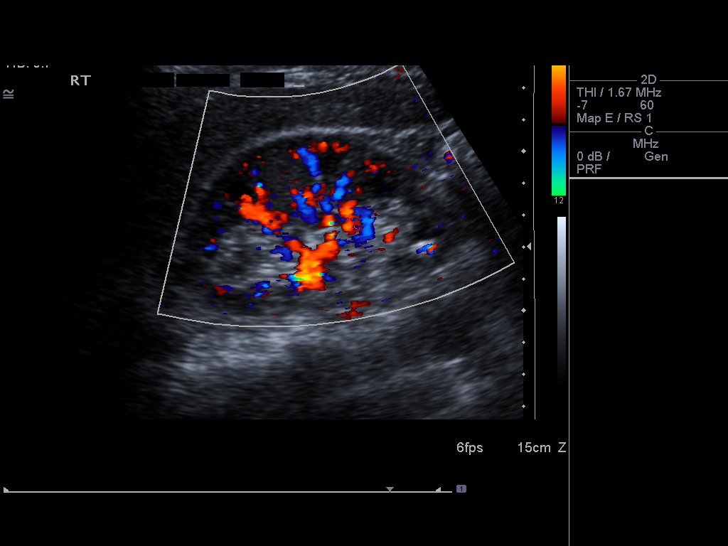
[im 10/40]
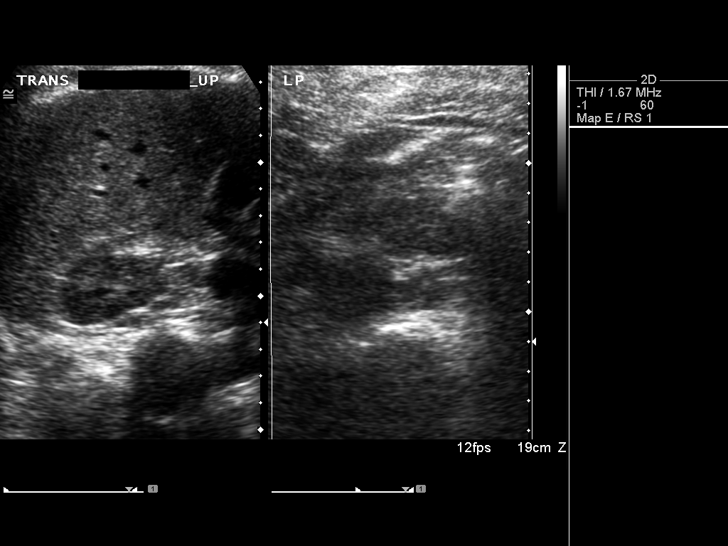
[im 14/40]
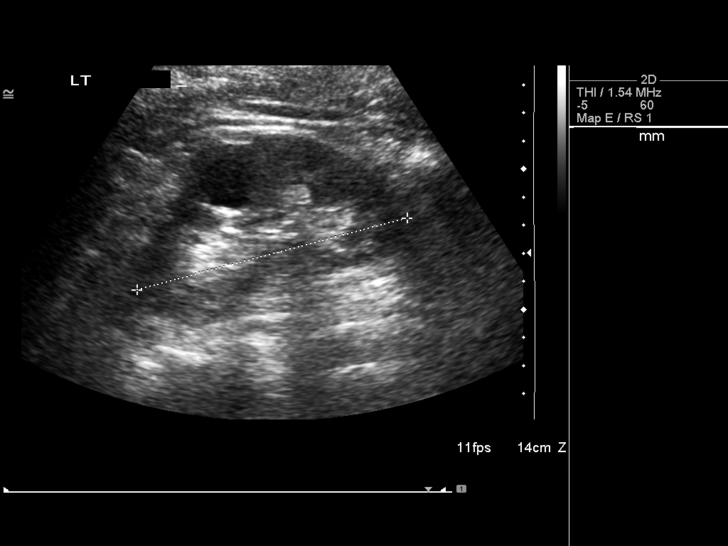
[im 15/40]
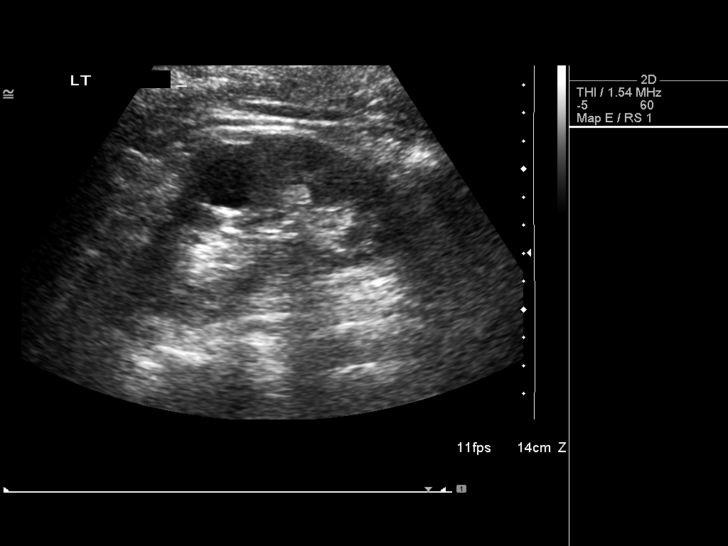
[im 18/40]
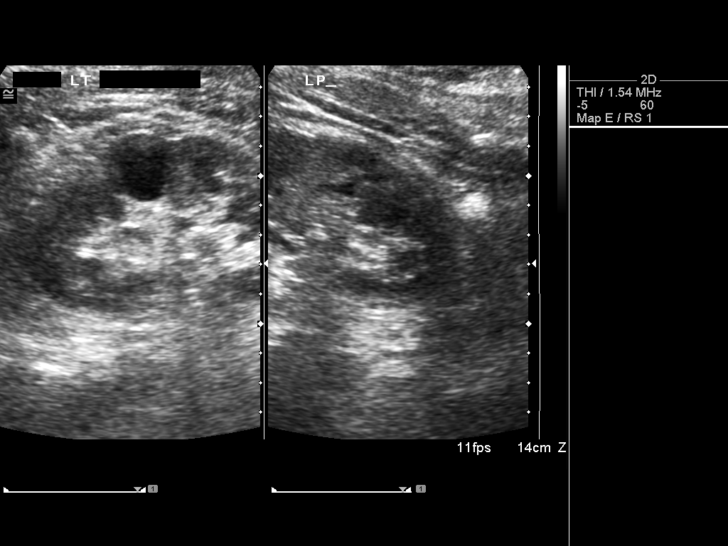
[im 22/40]
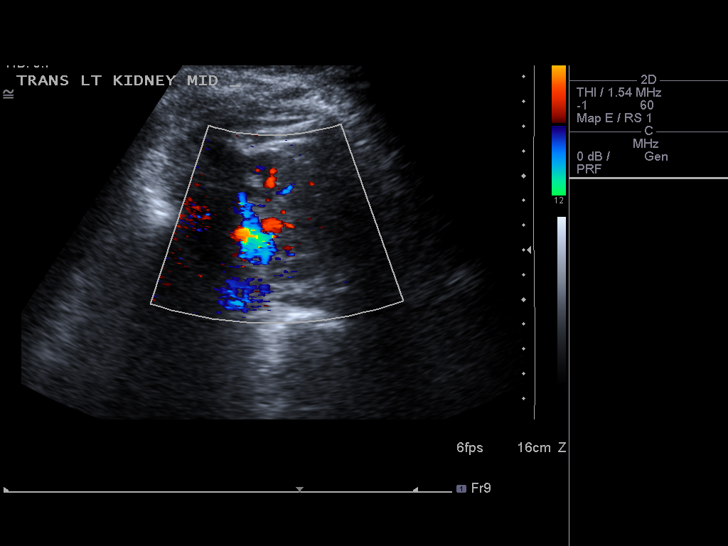
[im 25/40]
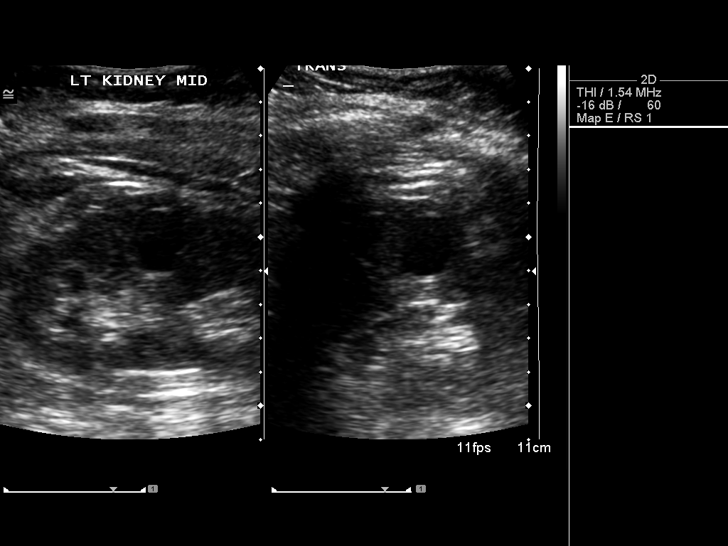
[im 27/40]
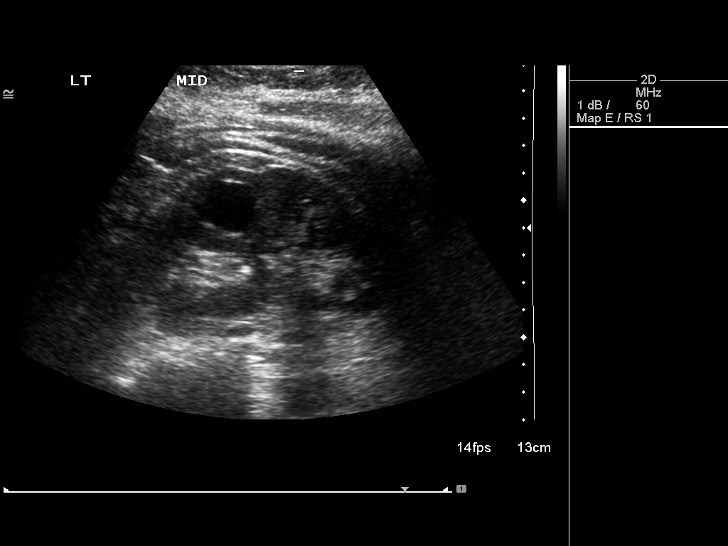
[im 30/40]
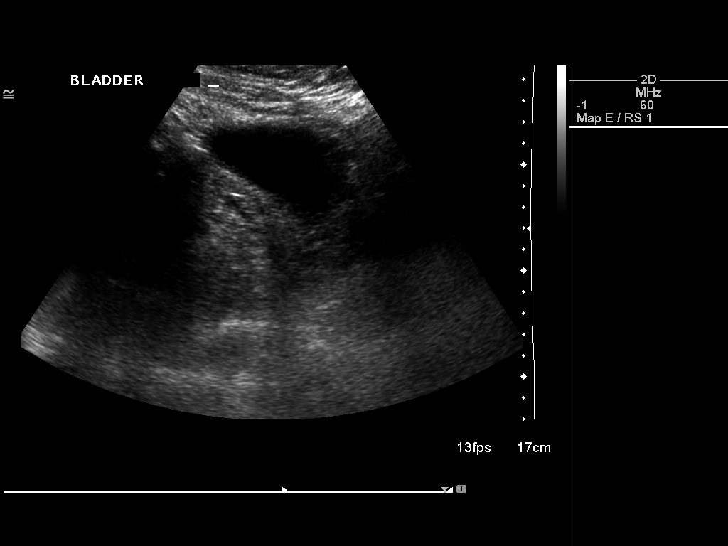
[im 33/40]
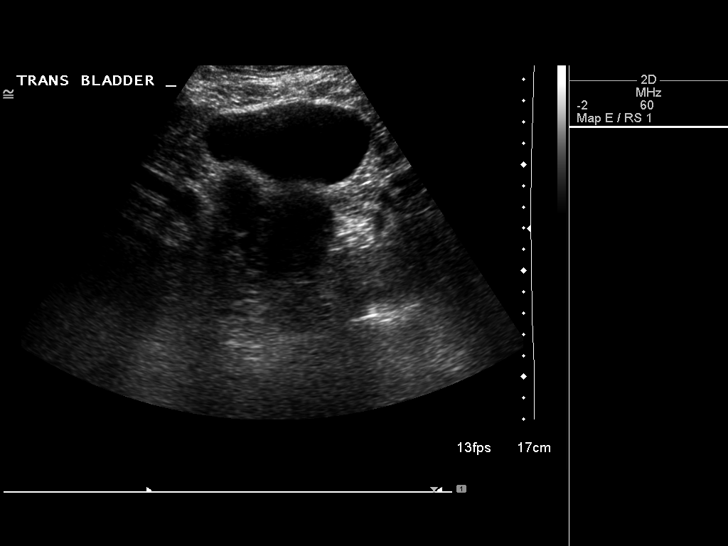
[im 36/40]
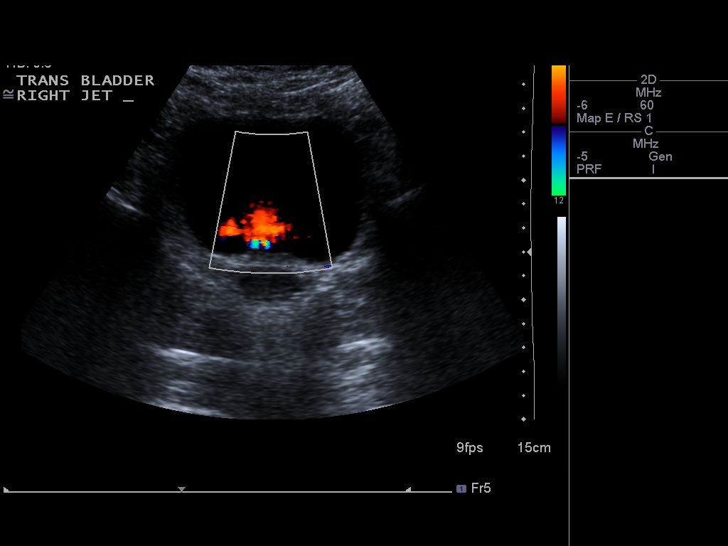
[im 40/40]
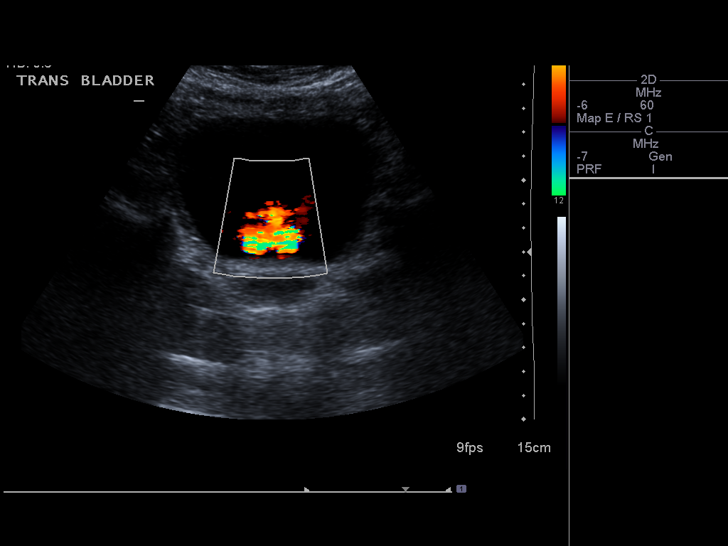

[14 of 25 positions shown; findings below may reference images not displayed]

FINDINGS: Right Kidney:

Length: 9.9 cm.. Echogenic focus in the lower pole is consistent
with a nonobstructing calculus of 7 mm in diameter.

Left Kidney:

Length: 10.0 cm.. A hypoechoic structure in the upper mid if kidney
is noted of 1.7 x 2.0 x 1.7 cm. Most consistent with cysts. No solid
renal lesion is seen

Bladder:

Urinary bladder is unremarkable.  Bilateral ureteral jets are noted.
IMPRESSION: 1. The low-attenuation structure noted on CT does appear to
correspond to a left renal cyst of 2.0 cm.
2. Nonobstructing 7 mm lower pole right renal calculus

## 2016-08-09 DIAGNOSIS — J301 Allergic rhinitis due to pollen: Secondary | ICD-10-CM | POA: Diagnosis not present

## 2016-08-09 DIAGNOSIS — J3089 Other allergic rhinitis: Secondary | ICD-10-CM | POA: Diagnosis not present

## 2016-08-09 DIAGNOSIS — J4540 Moderate persistent asthma, uncomplicated: Secondary | ICD-10-CM | POA: Diagnosis not present

## 2016-08-09 DIAGNOSIS — J0100 Acute maxillary sinusitis, unspecified: Secondary | ICD-10-CM | POA: Diagnosis not present

## 2016-08-28 DIAGNOSIS — Z3040 Encounter for surveillance of contraceptives, unspecified: Secondary | ICD-10-CM | POA: Diagnosis not present

## 2016-09-12 DIAGNOSIS — F321 Major depressive disorder, single episode, moderate: Secondary | ICD-10-CM | POA: Diagnosis not present

## 2016-09-12 DIAGNOSIS — F411 Generalized anxiety disorder: Secondary | ICD-10-CM | POA: Diagnosis not present

## 2016-11-23 DIAGNOSIS — Z30430 Encounter for insertion of intrauterine contraceptive device: Secondary | ICD-10-CM | POA: Diagnosis not present

## 2016-11-23 DIAGNOSIS — Z309 Encounter for contraceptive management, unspecified: Secondary | ICD-10-CM | POA: Diagnosis not present

## 2016-11-29 DIAGNOSIS — F411 Generalized anxiety disorder: Secondary | ICD-10-CM | POA: Diagnosis not present

## 2016-11-29 DIAGNOSIS — Z30430 Encounter for insertion of intrauterine contraceptive device: Secondary | ICD-10-CM | POA: Diagnosis not present

## 2016-11-29 DIAGNOSIS — F321 Major depressive disorder, single episode, moderate: Secondary | ICD-10-CM | POA: Diagnosis not present

## 2018-02-15 DIAGNOSIS — T7840XD Allergy, unspecified, subsequent encounter: Secondary | ICD-10-CM | POA: Diagnosis not present

## 2018-02-15 DIAGNOSIS — J019 Acute sinusitis, unspecified: Secondary | ICD-10-CM | POA: Diagnosis not present

## 2018-05-06 DIAGNOSIS — F321 Major depressive disorder, single episode, moderate: Secondary | ICD-10-CM | POA: Diagnosis not present

## 2018-05-06 DIAGNOSIS — F411 Generalized anxiety disorder: Secondary | ICD-10-CM | POA: Diagnosis not present

## 2018-06-13 DIAGNOSIS — N760 Acute vaginitis: Secondary | ICD-10-CM | POA: Diagnosis not present

## 2018-09-03 DIAGNOSIS — F411 Generalized anxiety disorder: Secondary | ICD-10-CM | POA: Diagnosis not present

## 2018-09-03 DIAGNOSIS — F321 Major depressive disorder, single episode, moderate: Secondary | ICD-10-CM | POA: Diagnosis not present

## 2018-10-07 DIAGNOSIS — J3089 Other allergic rhinitis: Secondary | ICD-10-CM | POA: Diagnosis not present

## 2018-10-07 DIAGNOSIS — J45909 Unspecified asthma, uncomplicated: Secondary | ICD-10-CM | POA: Diagnosis not present

## 2018-11-07 DIAGNOSIS — L709 Acne, unspecified: Secondary | ICD-10-CM | POA: Diagnosis not present

## 2018-11-07 DIAGNOSIS — Z0000 Encounter for general adult medical examination without abnormal findings: Secondary | ICD-10-CM | POA: Diagnosis not present

## 2018-11-07 DIAGNOSIS — Z23 Encounter for immunization: Secondary | ICD-10-CM | POA: Diagnosis not present

## 2018-11-07 DIAGNOSIS — E785 Hyperlipidemia, unspecified: Secondary | ICD-10-CM | POA: Diagnosis not present

## 2018-11-07 DIAGNOSIS — R030 Elevated blood-pressure reading, without diagnosis of hypertension: Secondary | ICD-10-CM | POA: Diagnosis not present

## 2018-12-09 DIAGNOSIS — Z1231 Encounter for screening mammogram for malignant neoplasm of breast: Secondary | ICD-10-CM | POA: Diagnosis not present

## 2018-12-09 DIAGNOSIS — Z113 Encounter for screening for infections with a predominantly sexual mode of transmission: Secondary | ICD-10-CM | POA: Diagnosis not present

## 2018-12-09 DIAGNOSIS — Z01419 Encounter for gynecological examination (general) (routine) without abnormal findings: Secondary | ICD-10-CM | POA: Diagnosis not present

## 2018-12-09 DIAGNOSIS — Z6835 Body mass index (BMI) 35.0-35.9, adult: Secondary | ICD-10-CM | POA: Diagnosis not present

## 2018-12-09 DIAGNOSIS — R309 Painful micturition, unspecified: Secondary | ICD-10-CM | POA: Diagnosis not present

## 2018-12-30 DIAGNOSIS — F321 Major depressive disorder, single episode, moderate: Secondary | ICD-10-CM | POA: Diagnosis not present

## 2018-12-30 DIAGNOSIS — F411 Generalized anxiety disorder: Secondary | ICD-10-CM | POA: Diagnosis not present

## 2019-01-23 DIAGNOSIS — J301 Allergic rhinitis due to pollen: Secondary | ICD-10-CM | POA: Diagnosis not present

## 2019-01-23 DIAGNOSIS — R05 Cough: Secondary | ICD-10-CM | POA: Diagnosis not present

## 2019-01-23 DIAGNOSIS — J3089 Other allergic rhinitis: Secondary | ICD-10-CM | POA: Diagnosis not present

## 2019-01-23 DIAGNOSIS — J4540 Moderate persistent asthma, uncomplicated: Secondary | ICD-10-CM | POA: Diagnosis not present

## 2019-04-24 DIAGNOSIS — F321 Major depressive disorder, single episode, moderate: Secondary | ICD-10-CM | POA: Diagnosis not present

## 2019-04-24 DIAGNOSIS — F411 Generalized anxiety disorder: Secondary | ICD-10-CM | POA: Diagnosis not present

## 2019-08-21 DIAGNOSIS — F411 Generalized anxiety disorder: Secondary | ICD-10-CM | POA: Diagnosis not present

## 2019-08-21 DIAGNOSIS — F321 Major depressive disorder, single episode, moderate: Secondary | ICD-10-CM | POA: Diagnosis not present

## 2020-05-20 ENCOUNTER — Encounter (HOSPITAL_BASED_OUTPATIENT_CLINIC_OR_DEPARTMENT_OTHER): Payer: Self-pay | Admitting: Emergency Medicine

## 2020-05-20 ENCOUNTER — Emergency Department (HOSPITAL_BASED_OUTPATIENT_CLINIC_OR_DEPARTMENT_OTHER)
Admission: EM | Admit: 2020-05-20 | Discharge: 2020-05-20 | Disposition: A | Discharge status: Home / Self Care | Payer: 59 | Attending: Emergency Medicine | Admitting: Emergency Medicine

## 2020-05-20 ENCOUNTER — Other Ambulatory Visit: Payer: Self-pay

## 2020-05-20 DIAGNOSIS — Y92009 Unspecified place in unspecified non-institutional (private) residence as the place of occurrence of the external cause: Secondary | ICD-10-CM | POA: Insufficient documentation

## 2020-05-20 DIAGNOSIS — S80862A Insect bite (nonvenomous), left lower leg, initial encounter: Secondary | ICD-10-CM | POA: Insufficient documentation

## 2020-05-20 DIAGNOSIS — Y9389 Activity, other specified: Secondary | ICD-10-CM | POA: Diagnosis not present

## 2020-05-20 DIAGNOSIS — S40862A Insect bite (nonvenomous) of left upper arm, initial encounter: Secondary | ICD-10-CM | POA: Insufficient documentation

## 2020-05-20 DIAGNOSIS — J45909 Unspecified asthma, uncomplicated: Secondary | ICD-10-CM | POA: Diagnosis not present

## 2020-05-20 DIAGNOSIS — Y999 Unspecified external cause status: Secondary | ICD-10-CM | POA: Diagnosis not present

## 2020-05-20 DIAGNOSIS — L509 Urticaria, unspecified: Secondary | ICD-10-CM | POA: Diagnosis not present

## 2020-05-20 DIAGNOSIS — L299 Pruritus, unspecified: Secondary | ICD-10-CM | POA: Diagnosis present

## 2020-05-20 DIAGNOSIS — W57XXXA Bitten or stung by nonvenomous insect and other nonvenomous arthropods, initial encounter: Secondary | ICD-10-CM | POA: Insufficient documentation

## 2020-05-20 HISTORY — DX: Unspecified asthma, uncomplicated: J45.909

## 2020-05-20 HISTORY — DX: Depression, unspecified: F32.A

## 2020-05-20 MED ORDER — BENADRYL ITCH STOPPING 2 % EX GEL: 1.0000 "application " | Freq: Four times a day (QID) | CUTANEOUS | 0 refills | Status: AC | PRN

## 2020-05-20 MED ORDER — PREDNISONE 20 MG PO TABS: ORAL_TABLET | ORAL | 0 refills | Status: AC

## 2020-05-20 NOTE — ED Notes (Signed)
ED Provider at bedside. 

## 2020-05-20 NOTE — ED Triage Notes (Signed)
Pt states she has a rash all over her body  Pt states it started on her wrist and has spread since then  Pt states her whole body burns and itches and she juts generally does not feel well

## 2020-05-20 NOTE — ED Provider Notes (Signed)
[facility] EMERGENCY DEPARTMENT Provider Note   CSN: [id number] Arrival date & time: 05/20/20  7026     History Chief Complaint  Patient presents with  . Rash    Candice Noble is a 49 y.o. female.  HPI Has very itchy papules on arms legs and on back.  She reports it both burn and itch intensely.  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  She reports it seems like these areas are spreading.  At first she thought they were insect bites but now they are more of them.  She reports first they swell up into a red swollen itchy area and then have a central area.  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  She is concerned because the itching continues and she has had a hard time sleeping.  She reports she just does not feel well but does not have other specific complaints.  Patient denies that she was on the beach.  She reports she is a pool where she was housesitting.    Past Medical History:  Diagnosis Date  . Abnormal Pap smear 10/26/2009   ASCUS  . Alopecia 01/23/12  . Anxiety   . Asthma   . Breast pain 01/23/12  . BV (bacterial vaginosis)   . Depression   . UTI (urinary tract infection)     There are no problems to display for this patient.   History reviewed. No pertinent surgical history.   OB History    Gravida  1   Para  1   Term      Preterm      AB      Living  1     SAB      TAB      Ectopic      Multiple      Live Births              Family History  Problem Relation Age of Onset  . Cancer Father        PROSTATE, stage 4 colon     Social History   Tobacco Use  . Smoking status: Never Smoker  . Smokeless tobacco: Never Used  Vaping Use  . Vaping Use: Never used  Substance Use Topics  . Alcohol use: Yes    Comment: occassionally   . Drug use: No     Home Medications Prior to Admission medications   Medication Sig Start Date End Date Taking? Authorizing Provider  albuterol (PROVENTIL HFA;VENTOLIN HFA) 108 (90 BASE) MCG/ACT inhaler Inhale 2 puffs into the lungs every 6 (six) hours as needed for wheezing or shortness of breath.    [provider]  ALPRAZolam Prudy Feeler) 0.5 MG tablet Take 1 tablet (0.5 mg total) by mouth 2 (two) times daily as needed for anxiety. 04/06/14   Pisciotta, Joni Reining, PA-C  Azelastine-Fluticasone (DYMISTA NA) Place 1 spray into both nostrils daily.     [provider]  clindamycin-benzoyl peroxide (BENZACLIN) gel Apply 1 application topically daily.    [provider]  DIPHENHYDRAMINE HCL, TOPICAL, (BENADRYL ITCH STOPPING) 2 % GEL Apply 1 application topically 4 (four) times daily as needed. 05/20/20   Arby Barrette, MD  Levocetirizine Dihydrochloride (XYZAL PO) Take 5 mg by mouth daily.     [provider]  mometasone-formoterol (DULERA) 200-5  MCG/ACT AERO Inhale 2 puffs into the lungs daily.    [provider]  Montelukast Sodium (SINGULAIR PO) Take 10 mg by mouth daily.     [provider]  Multiple Vitamins-Minerals (HAIR/SKIN/NAILS PO) Take 3 tablets by mouth daily.    [provider]  predniSONE (DELTASONE) 20 MG tablet 3 tabs po day one, then 2 po daily x 4 days 05/20/20   Arby Barrette, MD  tretinoin (RETIN-A) 0.025 % gel Apply 1 application topically at bedtime.    [provider]  Vilazodone HCl (VIIBRYD) 40 MG TABS Take 40 mg by mouth daily.    [provider]    Allergies    Other  Review of Systems   Review of Systems Constitutional no fevers or chills.  General malaise. Respiratory: No shortness of breath no cough. GI: No abdominal pain no nausea no vomiting Physical Exam Updated Vital Signs BP (!) 149/111 (BP Location: Left Arm)   Pulse 78   Temp 98.4 F (36.9 C) (Oral)   Resp 14   Ht 5' (1.524 m)   Wt 83 kg    LMP 05/11/2020 (Exact Date)   SpO2 97%   BMI 35.74 kg/m   Physical Exam Constitutional:      Comments: Alert nontoxic well in appearance.  No respiratory distress.  HENT:     Mouth/Throat:     Pharynx: Oropharynx is clear.  Eyes:     Extraocular Movements: Extraocular movements intact.     Conjunctiva/sclera: Conjunctivae normal.     Pupils: Pupils are equal, round, and reactive to light.  Cardiovascular:     Rate and Rhythm: Normal rate and regular rhythm.  Pulmonary:     Effort: Pulmonary effort is normal.     Breath sounds: Normal breath sounds.  Musculoskeletal:        General: No swelling. Normal range of motion.     Cervical back: Neck supple.     Right lower leg: No edema.     Left lower leg: No edema.  Skin:    General: Skin is warm and dry.     Findings: Rash present.     Comments: Patient has diffuse papules that have a subtle central area.  On older lesions this small central area is slightly eroded and others with very small intact none purulent elevation.  Papules are about 1 cm in average however there are other larger about 2 cm hives.  These are fairly diffuse.  About 4 on the left ankle and lower leg.  5 or so on the left volar forearm.  3 in a fairly continuous line on the left shoulder.  Others sparse on upper legs  Neurological:     General: No focal deficit present.     Mental Status: She is oriented to person, place, and time.     Coordination: Coordination normal.  Psychiatric:        Mood and Affect: Mood normal.     ED Results / Procedures / Treatments   Labs (all labs ordered are listed, but only abnormal results are displayed) Labs Reviewed - No data to display  EKG None  Radiology No results found.  Procedures Procedures (including critical care time)  Medications Ordered in ED Medications - No data to display  ED Course  I have reviewed the triage vital signs and the nursing notes.  Pertinent labs & imaging results that were  available during my care of the patient were reviewed by me and considered in  my medical decision making (see chart for details).    MDM Rules/Calculators/A&P                          Patient has a sparse, papular rash appears consistent with insect bites.  Some of these have hive and others are smaller and excoriated top.  No signs of secondary infection.  Patient is clinically well.  Patient describes getting a Decadron shot yesterday will extend out with several days of prednisone and have patient continue Benadryl application. Final Clinical Impression(s) / ED Diagnoses Final diagnoses:  Insect bite, unspecified site, initial encounter  Hives    Rx / DC Orders ED Discharge Orders         Ordered    predniSONE (DELTASONE) 20 MG tablet     Discontinue  Reprint     05/20/20 0721    DIPHENHYDRAMINE HCL, TOPICAL, (BENADRYL ITCH STOPPING) 2 % GEL  4 times daily PRN     Discontinue  Reprint     05/20/20 2952           Arby Barrette, MD 05/20/20 [phone]
